# Patient Record
Sex: Female | Born: 1985 | Race: White | Hispanic: No | Marital: Single | Smoking: Current every day smoker
Health system: Southern US, Community
[De-identification: ages and names within clinical notes are randomized; demographics above are authoritative.]

## PROBLEM LIST (undated history)

## (undated) DIAGNOSIS — I10 Essential (primary) hypertension: Secondary | ICD-10-CM

## (undated) DIAGNOSIS — G44209 Tension-type headache, unspecified, not intractable: Secondary | ICD-10-CM

## (undated) DIAGNOSIS — O24419 Gestational diabetes mellitus in pregnancy, unspecified control: Secondary | ICD-10-CM

## (undated) DIAGNOSIS — G43909 Migraine, unspecified, not intractable, without status migrainosus: Secondary | ICD-10-CM

## (undated) HISTORY — PX: TUBAL LIGATION: SHX77

## (undated) HISTORY — PX: WISDOM TOOTH EXTRACTION: SHX21

---

## 2010-09-20 ENCOUNTER — Emergency Department (HOSPITAL_COMMUNITY)
Admission: EM | Admit: 2010-09-20 | Discharge: 2010-09-20 | Disposition: A | Discharge status: Home / Self Care | Payer: Self-pay | Attending: Emergency Medicine | Admitting: Emergency Medicine

## 2010-09-20 ENCOUNTER — Emergency Department (HOSPITAL_COMMUNITY): Payer: Self-pay

## 2010-09-20 DIAGNOSIS — M79609 Pain in unspecified limb: Secondary | ICD-10-CM | POA: Insufficient documentation

## 2010-09-20 DIAGNOSIS — M2010 Hallux valgus (acquired), unspecified foot: Secondary | ICD-10-CM | POA: Insufficient documentation

## 2013-06-30 ENCOUNTER — Emergency Department (HOSPITAL_BASED_OUTPATIENT_CLINIC_OR_DEPARTMENT_OTHER)
Admission: EM | Admit: 2013-06-30 | Discharge: 2013-06-30 | Disposition: A | Discharge status: Home / Self Care | Payer: Medicaid Other | Attending: Emergency Medicine | Admitting: Emergency Medicine

## 2013-06-30 ENCOUNTER — Emergency Department (HOSPITAL_BASED_OUTPATIENT_CLINIC_OR_DEPARTMENT_OTHER): Payer: Medicaid Other

## 2013-06-30 ENCOUNTER — Encounter (HOSPITAL_BASED_OUTPATIENT_CLINIC_OR_DEPARTMENT_OTHER): Payer: Self-pay | Admitting: Emergency Medicine

## 2013-06-30 DIAGNOSIS — Y9289 Other specified places as the place of occurrence of the external cause: Secondary | ICD-10-CM | POA: Insufficient documentation

## 2013-06-30 DIAGNOSIS — M549 Dorsalgia, unspecified: Secondary | ICD-10-CM

## 2013-06-30 DIAGNOSIS — W108XXA Fall (on) (from) other stairs and steps, initial encounter: Secondary | ICD-10-CM | POA: Insufficient documentation

## 2013-06-30 DIAGNOSIS — X500XXA Overexertion from strenuous movement or load, initial encounter: Secondary | ICD-10-CM | POA: Insufficient documentation

## 2013-06-30 DIAGNOSIS — IMO0002 Reserved for concepts with insufficient information to code with codable children: Secondary | ICD-10-CM | POA: Insufficient documentation

## 2013-06-30 DIAGNOSIS — Y9389 Activity, other specified: Secondary | ICD-10-CM | POA: Insufficient documentation

## 2013-06-30 DIAGNOSIS — F17200 Nicotine dependence, unspecified, uncomplicated: Secondary | ICD-10-CM | POA: Insufficient documentation

## 2013-06-30 DIAGNOSIS — I10 Essential (primary) hypertension: Secondary | ICD-10-CM | POA: Insufficient documentation

## 2013-06-30 HISTORY — DX: Essential (primary) hypertension: I10

## 2013-06-30 MED ORDER — CYCLOBENZAPRINE HCL 5 MG PO TABS: 5.0000 mg | ORAL_TABLET | Freq: Two times a day (BID) | ORAL | Status: DC | PRN

## 2013-06-30 MED ORDER — TRAMADOL HCL 50 MG PO TABS: 50.0000 mg | ORAL_TABLET | Freq: Four times a day (QID) | ORAL | Status: DC | PRN

## 2013-06-30 NOTE — Discharge Instructions (Signed)
Back Pain, Adult Low back pain is very common. About 1 in 5 people have back pain.The cause of low back pain is rarely dangerous. The pain often gets better over time.About half of people with a sudden onset of back pain feel better in just 2 weeks. About 8 in 10 people feel better by 6 weeks.  CAUSES Some common causes of back pain include:  Strain of the muscles or ligaments supporting the spine.  Wear and tear (degeneration) of the spinal discs.  Arthritis.  Direct injury to the back. DIAGNOSIS Most of the time, the direct cause of low back pain is not known.However, back pain can be treated effectively even when the exact cause of the pain is unknown.Answering your caregiver's questions about your overall health and symptoms is one of the most accurate ways to make sure the cause of your pain is not dangerous. If your caregiver needs more information, he or she may order lab work or imaging tests (X-rays or MRIs).However, even if imaging tests show changes in your back, this usually does not require surgery. HOME CARE INSTRUCTIONS For many people, back pain returns.Since low back pain is rarely dangerous, it is often a condition that people can learn to manageon their own.   Remain active. It is stressful on the back to sit or stand in one place. Do not sit, drive, or stand in one place for more than 30 minutes at a time. Take short walks on level surfaces as soon as pain allows.Try to increase the length of time you walk each day.  Do not stay in bed.Resting more than 1 or 2 days can delay your recovery.  Do not avoid exercise or work.Your body is made to move.It is not dangerous to be active, even though your back may hurt.Your back will likely heal faster if you return to being active before your pain is gone.  Pay attention to your body when you bend and lift. Many people have less discomfortwhen lifting if they bend their knees, keep the load close to their bodies,and  avoid twisting. Often, the most comfortable positions are those that put less stress on your recovering back.  Find a comfortable position to sleep. Use a firm mattress and lie on your side with your knees slightly bent. If you lie on your back, put a pillow under your knees.  Only take over-the-counter or prescription medicines as directed by your caregiver. Over-the-counter medicines to reduce pain and inflammation are often the most helpful.Your caregiver may prescribe muscle relaxant drugs.These medicines help dull your pain so you can more quickly return to your normal activities and healthy exercise.  Put ice on the injured area.  Put ice in a plastic bag.  Place a towel between your skin and the bag.  Leave the ice on for 15-20 minutes, 03-04 times a day for the first 2 to 3 days. After that, ice and heat may be alternated to reduce pain and spasms.  Ask your caregiver about trying back exercises and gentle massage. This may be of some benefit.  Avoid feeling anxious or stressed.Stress increases muscle tension and can worsen back pain.It is important to recognize when you are anxious or stressed and learn ways to manage it.Exercise is a great option. SEEK MEDICAL CARE IF:  You have pain that is not relieved with rest or medicine.  You have pain that does not improve in 1 week.  You have new symptoms.  You are generally not feeling well. SEEK   IMMEDIATE MEDICAL CARE IF:   You have pain that radiates from your back into your legs.  You develop new bowel or bladder control problems.  You have unusual weakness or numbness in your arms or legs.  You develop nausea or vomiting.  You develop abdominal pain.  You feel faint. Document Released: 01/16/2005 Document Revised: 07/18/2011 Document Reviewed: 06/06/2010 [employer] Patient Information 2014 [employer].  

## 2013-06-30 NOTE — ED Notes (Signed)
Pt states she fell down steps Thursday injuring back and twisted her back Saturday.

## 2013-06-30 NOTE — ED Provider Notes (Signed)
CSN: [id number]     Arrival date & time 06/30/13  1003 History   First MD Initiated Contact with Patient 06/30/13 1006     Chief Complaint  Patient presents with  . Back Pain     (Consider location/radiation/quality/duration/timing/severity/associated sxs/prior Treatment) HPI Comments: Pt c/o lower back pain times 4 days after a fall down the front stairs outside. Pt denies loc with fall. No numbness or weakness. Has tried anti inflammatory without relief. C/o more pain on the right side but it is across the whole lower back. No previous injury. No dysuria  The history is provided by the patient. No language interpreter was used.    Past Medical History  Diagnosis Date  . Hypertension    History reviewed. No pertinent past surgical history. No family history on file. History  Substance Use Topics  . Smoking status: Current Every Day Smoker -- 0.50 packs/day    Types: Cigarettes  . Smokeless tobacco: Not on file  . Alcohol Use: No   OB History   Grav Para Term Preterm Abortions TAB SAB Ect Mult Living                 Review of Systems  Constitutional: Negative.   Respiratory: Negative.   Cardiovascular: Negative.       Allergies  Vicodin  Home Medications   Prior to Admission medications   Not on File   BP 119/100  Pulse 108  Temp(Src) 98.9 F (37.2 C) (Oral)  Resp 16  Ht 5\' 4"  (1.626 m)  Wt 202 lb (91.627 kg)  BMI 34.66 kg/m2  SpO2 96%  LMP 06/17/2013 Physical Exam  Vitals reviewed. Constitutional: She appears well-developed and well-nourished.  Cardiovascular: Normal rate and regular rhythm.   Pulmonary/Chest: Effort normal and breath sounds normal.  Musculoskeletal:  Lumbar spine and paraspinal tenderness  Neurological: She is alert. She exhibits normal muscle tone. Coordination normal.  Skin: Skin is warm and dry. No erythema.  Psychiatric: She has a normal mood and affect.    ED Course  Procedures (including critical care time) Labs  Review Labs Reviewed - No data to display  Imaging Review Dg Lumbar Spine Complete  06/30/2013   CLINICAL DATA:  Recent fall and twisting injury with low back pain.  EXAM: LUMBAR SPINE - COMPLETE 4+ VIEW  COMPARISON:  None.  FINDINGS: Alignment is anatomic. Vertebral body and disc space height are maintained. Minimal endplate degenerative change along the superior endplate of L4. No definite pars defects.  IMPRESSION: No acute findings to explain the patient's pain.   Electronically Signed   By: Leanna Battles M.D.   On: 06/30/2013 10:41     EKG Interpretation None      MDM   Final diagnoses:  Back pain    No bony abnormality. Pt is neurologically intact.will treat symptomatically and give referral to Dr. Vivi Barrack, NP 06/30/13 1055

## 2013-06-30 NOTE — ED Notes (Signed)
Patient transported to X-ray 

## 2013-07-03 NOTE — ED Provider Notes (Signed)
Medical screening examination/treatment/procedure(s) were performed by non-physician practitioner and as supervising physician I was immediately available for consultation/collaboration.   EKG Interpretation None        Candyce Churn III, MD 07/03/13 1115

## 2015-02-08 ENCOUNTER — Encounter (HOSPITAL_COMMUNITY): Payer: Self-pay

## 2015-02-08 ENCOUNTER — Emergency Department (HOSPITAL_COMMUNITY): Payer: Medicaid Other

## 2015-02-08 ENCOUNTER — Emergency Department (HOSPITAL_COMMUNITY)
Admission: EM | Admit: 2015-02-08 | Discharge: 2015-02-08 | Disposition: A | Discharge status: Home / Self Care | Payer: Medicaid Other | Attending: Emergency Medicine | Admitting: Emergency Medicine

## 2015-02-08 DIAGNOSIS — J209 Acute bronchitis, unspecified: Secondary | ICD-10-CM | POA: Diagnosis not present

## 2015-02-08 DIAGNOSIS — Z79899 Other long term (current) drug therapy: Secondary | ICD-10-CM | POA: Diagnosis not present

## 2015-02-08 DIAGNOSIS — O10013 Pre-existing essential hypertension complicating pregnancy, third trimester: Secondary | ICD-10-CM | POA: Insufficient documentation

## 2015-02-08 DIAGNOSIS — Z794 Long term (current) use of insulin: Secondary | ICD-10-CM | POA: Insufficient documentation

## 2015-02-08 DIAGNOSIS — O99513 Diseases of the respiratory system complicating pregnancy, third trimester: Secondary | ICD-10-CM | POA: Insufficient documentation

## 2015-02-08 DIAGNOSIS — O99333 Smoking (tobacco) complicating pregnancy, third trimester: Secondary | ICD-10-CM | POA: Diagnosis not present

## 2015-02-08 DIAGNOSIS — O9989 Other specified diseases and conditions complicating pregnancy, childbirth and the puerperium: Secondary | ICD-10-CM | POA: Diagnosis present

## 2015-02-08 DIAGNOSIS — F17210 Nicotine dependence, cigarettes, uncomplicated: Secondary | ICD-10-CM | POA: Diagnosis not present

## 2015-02-08 DIAGNOSIS — Z3A35 35 weeks gestation of pregnancy: Secondary | ICD-10-CM | POA: Diagnosis not present

## 2015-02-08 MED ORDER — AEROCHAMBER PLUS W/MASK MISC
1.0000 | Freq: Once | Status: DC
  Filled 2015-02-08: qty 1

## 2015-02-08 MED ORDER — ALBUTEROL SULFATE HFA 108 (90 BASE) MCG/ACT IN AERS
2.0000 | INHALATION_SPRAY | RESPIRATORY_TRACT | Status: DC | PRN
  Administered 2015-02-08: 2 via RESPIRATORY_TRACT
  Filled 2015-02-08: qty 6.7

## 2015-02-08 NOTE — ED Notes (Signed)
Patient acknowledges and understands discharge instructions. Signature pad not working.

## 2015-02-08 NOTE — Discharge Instructions (Signed)
Acute Bronchitis Use your albuterol inhaler 2 puffs every 4 hours as needed for cough or shortness of breath. Return if needed more than every 4 hours. Take Tylenol as directed for aches. Use saline nasal spray one squirt into each nostril every 2 hours while awake. Keep your scheduled appointment with your obstetrician in 4 days. Ask your obstetrician to help you to stop smoking. Bronchitis is when the airways that extend from the windpipe into the lungs get red, puffy, and painful (inflamed). Bronchitis often causes thick spit (mucus) to develop. This leads to a cough. A cough is the most common symptom of bronchitis. In acute bronchitis, the condition usually begins suddenly and goes away over time (usually in 2 weeks). Smoking, allergies, and asthma can make bronchitis worse. Repeated episodes of bronchitis may cause more lung problems. HOME CARE  Rest.  Drink enough fluids to keep your pee (urine) clear or pale yellow (unless you need to limit fluids as told by your doctor).  Only take over-the-counter or prescription medicines as told by your doctor.  Avoid smoking and secondhand smoke. These can make bronchitis worse. If you are a smoker, think about using nicotine gum or skin patches. Quitting smoking will help your lungs heal faster.  Reduce the chance of getting bronchitis again by:  Washing your hands often.  Avoiding people with cold symptoms.  Trying not to touch your hands to your mouth, nose, or eyes.  Follow up with your doctor as told. GET HELP IF: Your symptoms do not improve after 1 week of treatment. Symptoms include:  Cough.  Fever.  Coughing up thick spit.  Body aches.  Chest congestion.  Chills.  Shortness of breath.  Sore throat. GET HELP RIGHT AWAY IF:   You have an increased fever.  You have chills.  You have severe shortness of breath.  You have bloody thick spit (sputum).  You throw up (vomit) often.  You lose too much body fluid  (dehydration).  You have a severe headache.  You faint. MAKE SURE YOU:   Understand these instructions.  Will watch your condition.  Will get help right away if you are not doing well or get worse.   This information is not intended to replace advice given to you by your health care provider. Make sure you discuss any questions you have with your health care provider.   Document Released: 07/05/2007 Document Revised: 09/18/2012 Document Reviewed: 07/09/2012 Elsevier Interactive Patient Education [employer]2016 [employer]

## 2015-02-08 NOTE — ED Notes (Signed)
Pt c/o chest congestion and cough x 2 days and generalized body aches, fever, and bilateral ear pain x 1 day.  Pain score 5/10.  Pt reports taking OTC medication w/o relief.  Pt reports that she is [redacted] weeks pregnant.

## 2015-02-08 NOTE — Progress Notes (Addendum)
PT here with flu like symptoms, called here for fetal monitoring. Pt 35 weeks and get care in [place]. Called Dr Adrian Blackwaterstinson, informed pf pt status, reactive NST. OB cleared.

## 2015-02-08 NOTE — ED Provider Notes (Signed)
CSN: [id number]     Arrival date & time 02/08/15  1134 History   First MD Initiated Contact with Patient 02/08/15 1155     Chief Complaint  Patient presents with  . Cough  . Fever  . Generalized Body Aches     (Consider location/radiation/quality/duration/timing/severity/associated sxs/prior Treatment) HPI Complains of cough, nasal congestion onset 3 days ago. Patient's been taking over-the-counter flu medicine, without relief. No vomiting other associated symptoms include subjective fever. No shortness of breath. Patient reports that she checked her blood sugar this morning which was 82. Past Medical History  Diagnosis Date  . Hypertension   . Gestational diabetes mellitus    Past Surgical History  Procedure Laterality Date  . Wisdom tooth extraction     History reviewed. No pertinent family history. Social History  Substance Use Topics  . Smoking status: Current Every Day Smoker -- 0.50 packs/day    Types: Cigarettes  . Smokeless tobacco: None  . Alcohol Use: No   OB History    Gravida Para Term Preterm AB TAB SAB Ectopic Multiple Living   1              Review of Systems  Constitutional: Positive for fever.  HENT: Positive for congestion.   Respiratory: Positive for cough.   Genitourinary:       [redacted] weeks pregnant  Musculoskeletal: Positive for myalgias.       Diffuse myalgias  All other systems reviewed and are negative.     Allergies  Nitrofurantoin and Vicodin  Home Medications   Prior to Admission medications   Medication Sig Start Date End Date Taking? Authorizing Provider  butalbital-acetaminophen-caffeine (FIORICET, ESGIC) 50-325-40 MG tablet Take 1-2 tablets by mouth every 6 (six) hours as needed. 10/09/14  Yes Historical Provider, MD  escitalopram (LEXAPRO) 10 MG tablet Take 10 mg by mouth at bedtime. 01/15/15 01/15/16 Yes Historical Provider, MD  esomeprazole (NEXIUM) 20 MG capsule Take 20 mg by mouth at bedtime. 12/18/14 12/18/15 Yes Historical  Provider, MD  insulin NPH Human (HUMULIN N,NOVOLIN N) 100 UNIT/ML injection Inject 5 Units into the skin at bedtime.   Yes Historical Provider, MD  insulin NPH Human (HUMULIN N,NOVOLIN N) 100 UNIT/ML injection Administer 5 units once daily at bedtime; units subject to change as used during pregnancy. 02/03/15 03/30/15 Yes Historical Provider, MD  Phenylephrine-DM-GG-APAP (TYLENOL COLD/FLU SEVERE) 5-10-200-325 MG TABS Take 1 tablet by mouth every 4 (four) hours as needed (cold/flu symptoms.).   Yes Historical Provider, MD  Prenatal Vit-Fe Fumarate-FA (PRENATAL MULTIVITAMIN) TABS tablet Take 1 tablet by mouth daily at 12 noon.   Yes Historical Provider, MD   BP 114/78 mmHg  Pulse 105  Temp(Src) 98.3 F (36.8 C) (Oral)  Resp 20  SpO2 100% Physical Exam  Constitutional: She appears well-developed and well-nourished.  HENT:  Head: Normocephalic and atraumatic.  Right Ear: External ear normal.  Left Ear: External ear normal.  Nasal congestion. Bilateral tympanic membranes normal  Eyes: Conjunctivae are normal. Pupils are equal, round, and reactive to light.  Neck: Neck supple. No tracheal deviation present. No thyromegaly present.  Cardiovascular: Normal rate and regular rhythm.   No murmur heard. Pulmonary/Chest: Effort normal and breath sounds normal.  Abdominal: Soft. Bowel sounds are normal. She exhibits no distension. There is no tenderness.  Gravid, fetal heart rate 130s  Musculoskeletal: Normal range of motion. She exhibits no edema or tenderness.  Neurological: She is alert. Coordination normal.  Skin: Skin is warm and dry. No rash noted.  Psychiatric: She has a normal mood and affect.  Nursing note and vitals reviewed.   ED Course  Procedures (including critical care time) Labs Review Labs Reviewed - No data to display  Imaging Review No results found. I have personally reviewed and evaluated these images and lab results as part of my medical decision-making.   EKG  Interpretation None     Chest x-ray viewed by me No results found for this or any previous visit. Dg Chest 1 View  02/08/2015  CLINICAL DATA:  Chest congestion and cough. EXAM: CHEST 1 VIEW COMPARISON:  None. FINDINGS: Mediastinum and hilar structures normal. Lungs are clear. Heart size normal. Low lung volumes. No pleural effusion or pneumothorax. IMPRESSION: Low lung volumes.  No acute cardiopulmonary disease . Electronically Signed   By: Maisie Fushomas  Register   On: 02/08/2015 13:19    2:30 PM patient resting comfortably, coughing occasionally MDM  I counseled patient for 5 minutes on smoking cessation Final diagnoses:  None   plan saline nasal spray, albuterol HFA with spacer to go to use 2 puffs every 4 hours when necessary shortness of breath or cough. Return if any more than every 4 hours Keep scheduled appointment with OB/GYN in 4 days Diagnoses #1 acute bronchitis #2 tobacco abuse      Doug SouSam Tayler Lassen, MD 02/08/15 1435

## 2015-07-08 ENCOUNTER — Emergency Department (HOSPITAL_BASED_OUTPATIENT_CLINIC_OR_DEPARTMENT_OTHER)
Admission: EM | Admit: 2015-07-08 | Discharge: 2015-07-08 | Disposition: A | Discharge status: Home / Self Care | Payer: Medicaid Other | Attending: Emergency Medicine | Admitting: Emergency Medicine

## 2015-07-08 ENCOUNTER — Encounter (HOSPITAL_BASED_OUTPATIENT_CLINIC_OR_DEPARTMENT_OTHER): Payer: Self-pay

## 2015-07-08 DIAGNOSIS — Z79899 Other long term (current) drug therapy: Secondary | ICD-10-CM | POA: Insufficient documentation

## 2015-07-08 DIAGNOSIS — F17210 Nicotine dependence, cigarettes, uncomplicated: Secondary | ICD-10-CM | POA: Diagnosis not present

## 2015-07-08 DIAGNOSIS — J0100 Acute maxillary sinusitis, unspecified: Secondary | ICD-10-CM

## 2015-07-08 DIAGNOSIS — I10 Essential (primary) hypertension: Secondary | ICD-10-CM | POA: Diagnosis not present

## 2015-07-08 DIAGNOSIS — R51 Headache: Secondary | ICD-10-CM | POA: Diagnosis present

## 2015-07-08 HISTORY — DX: Tension-type headache, unspecified, not intractable: G44.209

## 2015-07-08 HISTORY — DX: Migraine, unspecified, not intractable, without status migrainosus: G43.909

## 2015-07-08 MED ORDER — AMOXICILLIN 500 MG PO CAPS: 500.0000 mg | ORAL_CAPSULE | Freq: Three times a day (TID) | ORAL | Status: AC

## 2015-07-08 MED ORDER — OXYCODONE-ACETAMINOPHEN 5-325 MG PO TABS: 2.0000 | ORAL_TABLET | ORAL | Status: AC | PRN

## 2015-07-08 MED ORDER — PREDNISONE 50 MG PO TABS
60.0000 mg | ORAL_TABLET | Freq: Once | ORAL | Status: AC
  Administered 2015-07-08: 60 mg via ORAL
  Filled 2015-07-08: qty 1

## 2015-07-08 MED ORDER — PREDNISONE 20 MG PO TABS: 20.0000 mg | ORAL_TABLET | Freq: Two times a day (BID) | ORAL | Status: AC

## 2015-07-08 MED ORDER — DIPHENHYDRAMINE HCL 25 MG PO CAPS
25.0000 mg | ORAL_CAPSULE | Freq: Once | ORAL | Status: AC
  Administered 2015-07-08: 25 mg via ORAL
  Filled 2015-07-08: qty 1

## 2015-07-08 MED ORDER — OXYCODONE-ACETAMINOPHEN 5-325 MG PO TABS
1.0000 | ORAL_TABLET | Freq: Once | ORAL | Status: AC
  Administered 2015-07-08: 1 via ORAL
  Filled 2015-07-08: qty 1

## 2015-07-08 MED ORDER — CETIRIZINE HCL 10 MG PO TABS: 10.0000 mg | ORAL_TABLET | Freq: Every day | ORAL | Status: AC

## 2015-07-08 NOTE — ED Provider Notes (Signed)
CSN: [id number]     Arrival date & time 07/08/15  1950 History  By signing my name below, I, Alyssa Grove, attest that this documentation has been prepared under the direction and in the presence of Rolland Porter, MD. Electronically Signed: Alyssa Grove, ED Scribe. 07/08/2015. 9:00 PM.    Chief Complaint  Patient presents with  . Headache    The history is provided by the patient. No language interpreter was used.    HPI Comments: Brittany Finley is a 30 y.o. female with h/o migraines on prn Fioricet who presents to the Emergency Department complaining of constant, gradual onset, occipital and sinus headache onset 5 days ago. Pt reports associated pain in the roof of her mouth, congestion, mild bilateral ear pain, productive cough with clear sputum and sore throat. She states that her pain is not similar to her migraines, which are typically throbbing in nature, frontal and parietal. Pt used Fioricet, Motrin, and Benedryl with no relief to her pain, which typically relieves her migraines. Pt has hx of asthma, but has not used her inhaler as she ran out of the prescription. Pt denies fever and rhinorrhea.   Past Medical History  Diagnosis Date  . Hypertension   . Gestational diabetes mellitus   . Migraine   . Tension headache    Past Surgical History  Procedure Laterality Date  . Wisdom tooth extraction    . Tubal ligation     No family history on file. Social History  Substance Use Topics  . Smoking status: Current Every Day Smoker -- 0.50 packs/day    Types: Cigarettes  . Smokeless tobacco: None  . Alcohol Use: No   OB History    Gravida Para Term Preterm AB TAB SAB Ectopic Multiple Living   Review of Systems  Constitutional: Negative for fever, chills, diaphoresis, appetite change and fatigue.  HENT: Positive for congestion, ear pain, sinus pressure and sore throat. Negative for mouth sores and trouble swallowing.   Eyes: Negative for visual disturbance.   Respiratory: Positive for cough. Negative for chest tightness, shortness of breath and wheezing.   Cardiovascular: Negative for chest pain.  Gastrointestinal: Negative for nausea, vomiting, abdominal pain, diarrhea and abdominal distention.  Endocrine: Negative for polydipsia, polyphagia and polyuria.  Genitourinary: Negative for dysuria, frequency and hematuria.  Musculoskeletal: Negative for gait problem.  Skin: Negative for color change, pallor and rash.  Neurological: Positive for headaches. Negative for dizziness, syncope and light-headedness.  Hematological: Does not bruise/bleed easily.  Psychiatric/Behavioral: Negative for behavioral problems and confusion.    Allergies  Nitrofurantoin and Vicodin  Home Medications   Prior to Admission medications   Medication Sig Start Date End Date Taking? Authorizing Provider  Varenicline Tartrate (CHANTIX PO) Take by mouth.   Yes Historical Provider, MD  amoxicillin (AMOXIL) 500 MG capsule Take 1 capsule (500 mg total) by mouth 3 (three) times daily. 07/08/15   Rolland Porter, MD  butalbital-acetaminophen-caffeine (FIORICET, ESGIC) [phone] MG tablet Take 1-2 tablets by mouth every 6 (six) hours as needed. 10/09/14   Historical Provider, MD  cetirizine (ZYRTEC) 10 MG tablet Take 1 tablet (10 mg total) by mouth daily. 1 po q day prn allergies 07/08/15   Rolland Porter, MD  insulin NPH Human (HUMULIN N,NOVOLIN N) 100 UNIT/ML injection Administer 5 units once daily at bedtime; units subject to change as used during pregnancy. 02/03/15 03/30/15  Historical Provider, MD  oxyCODONE-acetaminophen (PERCOCET/ROXICET) 5-325  MG tablet Take 2 tablets by mouth every 4 (four) hours as needed. 07/08/15   Rolland PorterMark Chau Sawin, MD  predniSONE (DELTASONE) 20 MG tablet Take 1 tablet (20 mg total) by mouth 2 (two) times daily with a meal. 07/08/15   Rolland PorterMark Kenzly Rogoff, MD   BP 131/89 mmHg  Pulse 74  Temp(Src) 98.8 F (37.1 C) (Oral)  Resp 20  Ht 5\' 3"  (1.6 m)  Wt 180 lb (81.647 kg)  BMI 31.89  kg/m2  SpO2 97%  LMP 05/31/2015  Breastfeeding? Unknown Physical Exam  Constitutional: She is oriented to person, place, and time. She appears well-developed and well-nourished. No distress.  HENT:  Head: Normocephalic.  Mouth/Throat: No oropharyngeal exudate.  Positive nasal Erythema and mucosal edema. Mild oropharyngeal erythema. No tonsillar hypertrophy or exudate. Mild erythema and effusion to bilateral TMs. Bilateral occipital tenderness.   Eyes: Conjunctivae are normal. Pupils are equal, round, and reactive to light. No scleral icterus.  Neck: Normal range of motion. Neck supple. No thyromegaly present.  Cardiovascular: Normal rate and regular rhythm.  Exam reveals no gallop and no friction rub.   No murmur heard. Pulmonary/Chest: Effort normal. No respiratory distress. She has wheezes. She has no rales.  + expiatory wheeze in all fields   Abdominal: Soft. Bowel sounds are normal. She exhibits no distension. There is no tenderness. There is no rebound.  Musculoskeletal: Normal range of motion.  Neurological: She is alert and oriented to person, place, and time.  Skin: Skin is warm and dry. No rash noted.  Psychiatric: She has a normal mood and affect. Her behavior is normal.  Nursing note and vitals reviewed.   ED Course  Procedures (including critical care time) DIAGNOSTIC STUDIES: Oxygen Saturation is 97% on RA, normal by my interpretation.    COORDINATION OF CARE: 8:52 PM Discussed treatment plan with pt at bedside which includes steroids and pt agreed to plan.    MDM   Final diagnoses:  Acute maxillary sinusitis, recurrence not specified    Upper respiratory infection symptoms with sinus pain and pressure. Will treat with steroids and antihistamine. I given a prescription for amoxicillin. I have asked her to wait 48-72 hours before initiating. If not improving may fill and take.   Rolland PorterMark [place], MD 07/08/15 2103

## 2015-07-08 NOTE — ED Notes (Signed)
Pt endorses photo- and phonophobia with the HA. Pt states this is different from her usual migraines in that her nose and throat ache. Pt states she has some post nasal drainage. Pt also states she has a productive cough with thick cloudy sputum.

## 2015-07-08 NOTE — ED Notes (Signed)
C/o HA since Sunday-reports hx of tension HA and migraine-no relief with fiorcet-NAD-steady gait

## 2015-07-08 NOTE — Discharge Instructions (Signed)
If your symptoms do not improve the next 48 hours, fill and take your prescription for amoxicillin.   Sinusitis, Adult Sinusitis is redness, soreness, and inflammation of the paranasal sinuses. Paranasal sinuses are air pockets within the bones of your face. They are located beneath your eyes, in the middle of your forehead, and above your eyes. In healthy paranasal sinuses, mucus is able to drain out, and air is able to circulate through them by way of your nose. However, when your paranasal sinuses are inflamed, mucus and air can become trapped. This can allow bacteria and other germs to grow and cause infection. Sinusitis can develop quickly and last only a short time (acute) or continue over a long period (chronic). Sinusitis that lasts for more than 12 weeks is considered chronic. CAUSES Causes of sinusitis include:  Allergies.  Structural abnormalities, such as displacement of the cartilage that separates your nostrils (deviated septum), which can decrease the air flow through your nose and sinuses and affect sinus drainage.  Functional abnormalities, such as when the small hairs (cilia) that line your sinuses and help remove mucus do not work properly or are not present. SIGNS AND SYMPTOMS Symptoms of acute and chronic sinusitis are the same. The primary symptoms are pain and pressure around the affected sinuses. Other symptoms include:  Upper toothache.  Earache.  Headache.  Bad breath.  Decreased sense of smell and taste.  A cough, which worsens when you are lying flat.  Fatigue.  Fever.  Thick drainage from your nose, which often is green and may contain pus (purulent).  Swelling and warmth over the affected sinuses. DIAGNOSIS Your health care provider will perform a physical exam. During your exam, your health care provider may perform any of the following to help determine if you have acute sinusitis or chronic sinusitis:  Look in your nose for signs of abnormal  growths in your nostrils (nasal polyps).  Tap over the affected sinus to check for signs of infection.  View the inside of your sinuses using an imaging device that has a light attached (endoscope). If your health care provider suspects that you have chronic sinusitis, one or more of the following tests may be recommended:  Allergy tests.  Nasal culture. A sample of mucus is taken from your nose, sent to a lab, and screened for bacteria.  Nasal cytology. A sample of mucus is taken from your nose and examined by your health care provider to determine if your sinusitis is related to an allergy. TREATMENT Most cases of acute sinusitis are related to a viral infection and will resolve on their own within 10 days. Sometimes, medicines are prescribed to help relieve symptoms of both acute and chronic sinusitis. These may include pain medicines, decongestants, nasal steroid sprays, or saline sprays. However, for sinusitis related to a bacterial infection, your health care provider will prescribe antibiotic medicines. These are medicines that will help kill the bacteria causing the infection. Rarely, sinusitis is caused by a fungal infection. In these cases, your health care provider will prescribe antifungal medicine. For some cases of chronic sinusitis, surgery is needed. Generally, these are cases in which sinusitis recurs more than 3 times per year, despite other treatments. HOME CARE INSTRUCTIONS  Drink plenty of water. Water helps thin the mucus so your sinuses can drain more easily.  Use a humidifier.  Inhale steam 3-4 times a day (for example, sit in the bathroom with the shower running).  Apply a warm, moist washcloth to your face  3-4 times a day, or as directed by your health care provider.  Use saline nasal sprays to help moisten and clean your sinuses.  Take medicines only as directed by your health care provider.  If you were prescribed either an antibiotic or antifungal medicine,  finish it all even if you start to feel better. SEEK IMMEDIATE MEDICAL CARE IF:  You have increasing pain or severe headaches.  You have nausea, vomiting, or drowsiness.  You have swelling around your face.  You have vision problems.  You have a stiff neck.  You have difficulty breathing.   This information is not intended to replace advice given to you by your health care provider. Make sure you discuss any questions you have with your health care provider.   Document Released: 01/16/2005 Document Revised: 02/06/2014 Document Reviewed: 01/31/2011 Elsevier Interactive Patient Education [employer]2016 [employer] Inc.

## 2015-07-08 NOTE — ED Notes (Signed)
Pt teaching provided on the medications that she was given today which may cause drowsiness. Pt instructed not to drive or operate heavy machinery. Pt verbalized understanding.

## 2015-09-11 ENCOUNTER — Emergency Department (HOSPITAL_BASED_OUTPATIENT_CLINIC_OR_DEPARTMENT_OTHER): Payer: Medicaid Other

## 2015-09-11 ENCOUNTER — Encounter (HOSPITAL_BASED_OUTPATIENT_CLINIC_OR_DEPARTMENT_OTHER): Payer: Self-pay | Admitting: Emergency Medicine

## 2015-09-11 ENCOUNTER — Emergency Department (HOSPITAL_BASED_OUTPATIENT_CLINIC_OR_DEPARTMENT_OTHER)
Admission: EM | Admit: 2015-09-11 | Discharge: 2015-09-11 | Disposition: A | Discharge status: Home / Self Care | Payer: Medicaid Other | Attending: Emergency Medicine | Admitting: Emergency Medicine

## 2015-09-11 DIAGNOSIS — Z794 Long term (current) use of insulin: Secondary | ICD-10-CM | POA: Diagnosis not present

## 2015-09-11 DIAGNOSIS — M79672 Pain in left foot: Secondary | ICD-10-CM | POA: Insufficient documentation

## 2015-09-11 DIAGNOSIS — I10 Essential (primary) hypertension: Secondary | ICD-10-CM | POA: Diagnosis not present

## 2015-09-11 DIAGNOSIS — F17210 Nicotine dependence, cigarettes, uncomplicated: Secondary | ICD-10-CM | POA: Diagnosis not present

## 2015-09-11 DIAGNOSIS — E119 Type 2 diabetes mellitus without complications: Secondary | ICD-10-CM | POA: Diagnosis not present

## 2015-09-11 NOTE — Discharge Instructions (Signed)
X rays do not show a foreign body in your foot.  It is recommended that you see an orthopedic Dr. Melynda Kellerr Podiatist to discuss a procedure to explore your foot, or additional imaging tests to to identify its location.  Explorations of a foot without obvious foreign bodies are not performed in the ER.  You have been given the name of our local othopedic surgeon on call to contact. Or you may call a podiatrist of your choice.

## 2015-09-11 NOTE — ED Notes (Signed)
MD at bedside. 

## 2015-09-11 NOTE — ED Notes (Signed)
Patient transported to X-ray 

## 2015-09-11 NOTE — ED Triage Notes (Signed)
Pt in c/o possible glass FB to bottom of L foot, states has likely been there for approx 2 months but now is burning and itching. Ambulatory in NAD.

## 2015-09-11 NOTE — ED Provider Notes (Signed)
[facility]   CSN: [id number] Arrival date & time: 09/11/15  2139  First Provider Contact:  9:59 PM    By signing my name below, I, Vista Mink, attest that this documentation has been prepared under the direction and in the presence of Rolland Porter, MD. Electronically signed, Vista Mink, ED Scribe. 09/11/15. 10:11 PM.  History   Chief Complaint Chief Complaint  Patient presents with  . Foot Pain    HPI HPI Comments: Brittany Finley is a 30 y.o. female who presents to the Emergency Department complaining of possible foreign body in the bottom of her left foot; onset two months ago. Pt states she stepped on broken glass almost two months ago and complains of gradually worsening pain to the sole of her foot. Pt states that her mom attempted to remove any foreign bodies in the foot but then instructed the pt to come to the ED for evaluation.    The history is provided by the patient. No language interpreter was used.    Past Medical History:  Diagnosis Date  . Gestational diabetes mellitus   . Hypertension   . Migraine   . Tension headache     There are no active problems to display for this patient.   Past Surgical History:  Procedure Laterality Date  . TUBAL LIGATION    . WISDOM TOOTH EXTRACTION      OB History    Gravida Para Term Preterm AB Living   5 3 2 1   3    SAB TAB Ectopic Multiple Live Births                   Home Medications    Prior to Admission medications   Medication Sig Start Date End Date Taking? Authorizing Provider  amoxicillin (AMOXIL) 500 MG capsule Take 1 capsule (500 mg total) by mouth 3 (three) times daily. 07/08/15   Rolland Porter, MD  butalbital-acetaminophen-caffeine (FIORICET, ESGIC) [phone] MG tablet Take 1-2 tablets by mouth every 6 (six) hours as needed. 10/09/14   Historical Provider, MD  cetirizine (ZYRTEC) 10 MG tablet Take 1 tablet (10 mg total) by mouth daily. 1 po q day prn allergies 07/08/15   Rolland Porter, MD   insulin NPH Human (HUMULIN N,NOVOLIN N) 100 UNIT/ML injection Administer 5 units once daily at bedtime; units subject to change as used during pregnancy. 02/03/15 03/30/15  Historical Provider, MD  oxyCODONE-acetaminophen (PERCOCET/ROXICET) 5-325 MG tablet Take 2 tablets by mouth every 4 (four) hours as needed. 07/08/15   Rolland Porter, MD  predniSONE (DELTASONE) 20 MG tablet Take 1 tablet (20 mg total) by mouth 2 (two) times daily with a meal. 07/08/15   Rolland Porter, MD  Varenicline Tartrate (CHANTIX PO) Take by mouth.    Historical Provider, MD    Family History History reviewed. No pertinent family history.  Social History Social History  Substance Use Topics  . Smoking status: Current Every Day Smoker    Packs/day: 0.50    Types: Cigarettes  . Smokeless tobacco: Not on file  . Alcohol use No     Allergies   Nitrofurantoin and Vicodin [hydrocodone-acetaminophen]   Review of Systems Review of Systems  Constitutional: Negative for appetite change, chills, diaphoresis, fatigue and fever.  HENT: Negative for mouth sores, sore throat and trouble swallowing.   Eyes: Negative for visual disturbance.  Respiratory: Negative for cough, chest tightness, shortness of breath and wheezing.   Cardiovascular: Negative for chest pain.  Gastrointestinal: Negative for  abdominal distention, abdominal pain, diarrhea, nausea and vomiting.  Endocrine: Negative for polydipsia, polyphagia and polyuria.  Genitourinary: Negative for dysuria, frequency and hematuria.  Musculoskeletal: Positive for arthralgias (left foot). Negative for gait problem.  Skin: Negative for color change, pallor and rash.  Neurological: Negative for dizziness, syncope, light-headedness and headaches.  Hematological: Does not bruise/bleed easily.  Psychiatric/Behavioral: Negative for behavioral problems and confusion.  All other systems reviewed and are negative.  Physical Exam Updated Vital Signs BP 119/87 (BP Location: Left Arm)    Pulse 94   Temp 98.1 F (36.7 C) (Oral)   Resp 20   Ht 5\' 3"  (1.6 m)   Wt 180 lb (81.6 kg)   LMP 09/05/2015   SpO2 99%   BMI 31.89 kg/m   Physical Exam  Constitutional: She is oriented to person, place, and time. She appears well-developed and well-nourished. No distress.  HENT:  Head: Normocephalic.  Eyes: Conjunctivae are normal. Pupils are equal, round, and reactive to light. No scleral icterus.  Neck: Normal range of motion. Neck supple. No thyromegaly present.  Cardiovascular: Normal rate and regular rhythm.  Exam reveals no gallop and no friction rub.   No murmur heard. Pulmonary/Chest: Effort normal and breath sounds normal. No respiratory distress. She has no wheezes. She has no rales.  Abdominal: Soft. Bowel sounds are normal. She exhibits no distension. There is no tenderness. There is no rebound.  Musculoskeletal: Normal range of motion.  Neurological: She is alert and oriented to person, place, and time.  Skin: Skin is warm and dry. No rash noted.  Pt indiactes over the area of the sole of the foot near first metatarsal head. No erythema or signs of infection or foreign body  Psychiatric: She has a normal mood and affect. Her behavior is normal.     ED Treatments / Results  DIAGNOSTIC STUDIES: Oxygen Saturation is 99% on RA, normal by my interpretation.  COORDINATION OF CARE: 9:53 PM-Will order imaging of the foot. Discussed treatment plan with pt at bedside and pt agreed to plan.   Labs (all labs ordered are listed, but only abnormal results are displayed) Labs Reviewed - No data to display  EKG  EKG Interpretation None       Radiology Dg Foot Complete Left  Result Date: 09/11/2015 CLINICAL DATA:  Sensation of a small piece of glass in the left foot after stepping on a broken vase 2 months ago. EXAM: LEFT FOOT - COMPLETE 3+ VIEW COMPARISON:  09/20/2010. FINDINGS: A hallux valgus deformity is again demonstrated with valgus deformities involving all of  the MTP joints. No radiopaque foreign body in the area of patient concern in the plantar aspect of the distal foot, marked with a metallic marker. There is a suggestion of soft tissue swelling in that region. No fracture seen. IMPRESSION: 1. No radiopaque foreign body. 2. 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 Electronically Signed   By: Beckie SaltsSteven  Reid M.D.   On: 09/11/2015 22:21    Procedures Procedures (including critical care time)  Medications Ordered in ED Medications - No data to display   Initial Impression / Assessment and Plan / ED Course  I have reviewed the triage vital signs and the nursing notes.  Pertinent labs & imaging results that were available during my care of the patient were reviewed by me and considered in my medical decision making (see chart for details).  Clinical Course    X-ray show no obvious  foreign body. I explained to the patient that it is not fruitful to attempt to explore a foot for a warm body when the foreign bodies obvious clinically or radiographically. Referred her to orthopedics and/or podiatry for consideration for further imaging or exploration.  Final Clinical Impressions(s) / ED Diagnoses   Final diagnoses:  Foot pain, left    New Prescriptions New Prescriptions   No medications on file      Rolland Porter, MD 09/11/15 2225

## 2016-08-23 ENCOUNTER — Emergency Department (HOSPITAL_BASED_OUTPATIENT_CLINIC_OR_DEPARTMENT_OTHER)
Admission: EM | Admit: 2016-08-23 | Discharge: 2016-08-24 | Disposition: A | Discharge status: Home / Self Care | Payer: Self-pay | Attending: Emergency Medicine | Admitting: Emergency Medicine

## 2016-08-23 ENCOUNTER — Encounter (HOSPITAL_BASED_OUTPATIENT_CLINIC_OR_DEPARTMENT_OTHER): Payer: Self-pay

## 2016-08-23 ENCOUNTER — Emergency Department (HOSPITAL_BASED_OUTPATIENT_CLINIC_OR_DEPARTMENT_OTHER): Payer: Self-pay

## 2016-08-23 DIAGNOSIS — Z79899 Other long term (current) drug therapy: Secondary | ICD-10-CM | POA: Insufficient documentation

## 2016-08-23 DIAGNOSIS — Y9289 Other specified places as the place of occurrence of the external cause: Secondary | ICD-10-CM | POA: Insufficient documentation

## 2016-08-23 DIAGNOSIS — Y9389 Activity, other specified: Secondary | ICD-10-CM | POA: Insufficient documentation

## 2016-08-23 DIAGNOSIS — Y999 Unspecified external cause status: Secondary | ICD-10-CM | POA: Insufficient documentation

## 2016-08-23 DIAGNOSIS — W5501XA Bitten by cat, initial encounter: Secondary | ICD-10-CM | POA: Insufficient documentation

## 2016-08-23 DIAGNOSIS — F17210 Nicotine dependence, cigarettes, uncomplicated: Secondary | ICD-10-CM | POA: Insufficient documentation

## 2016-08-23 DIAGNOSIS — S60871A Other superficial bite of right wrist, initial encounter: Secondary | ICD-10-CM | POA: Insufficient documentation

## 2016-08-23 DIAGNOSIS — Z8632 Personal history of gestational diabetes: Secondary | ICD-10-CM | POA: Insufficient documentation

## 2016-08-23 DIAGNOSIS — I10 Essential (primary) hypertension: Secondary | ICD-10-CM | POA: Insufficient documentation

## 2016-08-23 HISTORY — DX: Gestational diabetes mellitus in pregnancy, unspecified control: O24.419

## 2016-08-23 MED ORDER — ACETAMINOPHEN 500 MG PO TABS
1000.0000 mg | ORAL_TABLET | Freq: Once | ORAL | Status: AC
  Administered 2016-08-23: 1000 mg via ORAL
  Filled 2016-08-23: qty 2

## 2016-08-23 MED ORDER — AMOXICILLIN-POT CLAVULANATE 875-125 MG PO TABS: 1.0000 | ORAL_TABLET | Freq: Two times a day (BID) | ORAL | 0 refills | Status: AC

## 2016-08-23 MED ORDER — AMOXICILLIN-POT CLAVULANATE 875-125 MG PO TABS
1.0000 | ORAL_TABLET | Freq: Once | ORAL | Status: AC
  Administered 2016-08-23: 1 via ORAL
  Filled 2016-08-23: qty 1

## 2016-08-23 NOTE — ED Notes (Signed)
Pt states she checked with friend and cats shots are UTD

## 2016-08-23 NOTE — ED Provider Notes (Signed)
[facility]   CSN: [id number] Arrival date & time: 08/23/16  2108   By signing my name below, I, Brittany Finley, attest that this documentation has been prepared under the direction and in the presence of Tilden Fossa, MD. Electronically signed, Brittany Finley, ED Scribe. 08/23/16. 10:30 PM.  History   Chief Complaint Chief Complaint  Patient presents with  . Animal Bite   The history is provided by the patient and medical records. No language interpreter was used.    Brittany Finley is a 31 y.o. female presenting to the Emergency Department concerning L wrist pain s/p an animal bite that occurred ~6 PM today. Small superficial wounds noted. No active bleeding. She states a cat attacked her after she accidentally touched it on the abdomen. Pt describes mild to moderate pain only when moving the wrist. She states she applied diluted alcohol and took an epsom salt bath, and one of her wounds continued to drain, though she her other superficial wounds have closed. Pt states she has taken ibuprofen without relief at home. No fever, abdominal pain, N/V, diaphoresis, numbness or weakness. No other complaints at this time.   Past Medical History:  Diagnosis Date  . Gestational diabetes    gestational  . Hypertension   . Migraine   . Tension headache     There are no active problems to display for this patient.   Past Surgical History:  Procedure Laterality Date  . TUBAL LIGATION    . WISDOM TOOTH EXTRACTION      OB History    Gravida Para Term Preterm AB Living   5 3 2 1   3    SAB TAB Ectopic Multiple Live Births                   Home Medications    Prior to Admission medications   Medication Sig Start Date End Date Taking? Authorizing Provider  escitalopram (LEXAPRO) 10 MG tablet Take 10 mg by mouth daily.   Yes [provider]  amoxicillin (AMOXIL) 500 MG capsule Take 1 capsule (500 mg total) by mouth 3 (three) times daily. 07/08/15    Rolland Porter, MD  butalbital-acetaminophen-caffeine (FIORICET, ESGIC) [phone] MG tablet Take 1-2 tablets by mouth every 6 (six) hours as needed. 10/09/14   [provider]  cetirizine (ZYRTEC) 10 MG tablet Take 1 tablet (10 mg total) by mouth daily. 1 po q day prn allergies 07/08/15   Rolland Porter, MD  insulin NPH Human (HUMULIN N,NOVOLIN N) 100 UNIT/ML injection Administer 5 units once daily at bedtime; units subject to change as used during pregnancy. 02/03/15 03/30/15  [provider]  oxyCODONE-acetaminophen (PERCOCET/ROXICET) 5-325 MG tablet Take 2 tablets by mouth every 4 (four) hours as needed. 07/08/15   Rolland Porter, MD  predniSONE (DELTASONE) 20 MG tablet Take 1 tablet (20 mg total) by mouth 2 (two) times daily with a meal. 07/08/15   Rolland Porter, MD  Varenicline Tartrate (CHANTIX PO) Take by mouth.    [provider]    Family History No family history on file.  Social History Social History  Substance Use Topics  . Smoking status: Current Every Day Smoker    Packs/day: 0.50    Types: Cigarettes  . Smokeless tobacco: Never Used  . Alcohol use No     Allergies   Nitrofurantoin and Vicodin [hydrocodone-acetaminophen]   Review of Systems Review of Systems  Constitutional: Negative for chills, diaphoresis and fever.  Respiratory: Negative for  shortness of breath.   Cardiovascular: Negative for palpitations.  Gastrointestinal: Negative for abdominal pain, nausea and vomiting.  Musculoskeletal: Negative for joint swelling.  Skin: Positive for color change and wound. Negative for rash.  Neurological: Negative for weakness and numbness.  All other systems reviewed and are negative.    Physical Exam Updated Vital Signs BP (!) 146/98 (BP Location: Left Arm)   Pulse (!) 106   Temp 98.6 F (37 C) (Oral)   Resp 18   Ht 5\' 4"  (1.626 m)   Wt 200 lb (90.7 kg)   LMP 08/10/2016   SpO2 97%   BMI 34.33 kg/m   Physical Exam  Constitutional: She is oriented  to person, place, and time. She appears well-developed and well-nourished.  HENT:  Head: Normocephalic and atraumatic.  Cardiovascular: Normal rate and regular rhythm.   No murmur heard. Pulmonary/Chest: Effort normal and breath sounds normal. No respiratory distress.  Abdominal: There is no guarding.  Musculoskeletal:  2+ radial pulses to the right upper extremity. There is moderate swelling and mild erythema to the radial aspect of the right wrist. Scattered abrasions over the right wrist. Left forearm with multiple linear scars. Wiggles all digits without difficulty. Flexion and extension intact at the wrist.  Neurological: She is alert and oriented to person, place, and time.  Skin: Skin is warm and dry.  Psychiatric: She has a normal mood and affect. Her behavior is normal.  Nursing note and vitals reviewed.    ED Treatments / Results  DIAGNOSTIC STUDIES: Oxygen Saturation is 97% on RA, NL by my interpretation.    COORDINATION OF CARE: 10:25 PM-Discussed next steps with pt. Pt verbalized understanding and is agreeable with the plan. Will order imaging and medications. Will Rx medications.   Labs (all labs ordered are listed, but only abnormal results are displayed) Labs Reviewed - No data to display  EKG  EKG Interpretation None       Radiology Dg Wrist Complete Right  Result Date: 08/23/2016 CLINICAL DATA:  Erythema and swelling after CT bites tonight. EXAM: RIGHT WRIST - COMPLETE 3+ VIEW COMPARISON:  None. FINDINGS: There is no evidence of fracture or dislocation. There is no evidence of arthropathy or other focal bone abnormality. Soft tissues are unremarkable. IMPRESSION: Negative. Electronically Signed   By: Ellery Plunkaniel R Mitchell M.D.   On: 08/23/2016 23:35    Procedures Procedures (including critical care time)  Medications Ordered in ED Medications  acetaminophen (TYLENOL) tablet 1,000 mg (1,000 mg Oral Given 08/23/16 2237)     Initial Impression / Assessment  and Plan / ED Course  I have reviewed the triage vital signs and the nursing notes.  Pertinent labs & imaging results that were available during my care of the patient were reviewed by me and considered in my medical decision making (see chart for details).     Patient here for evaluation of wrist pain following a cat bite. Immunizations are up to date on the cat. She has some local tenderness over the radial aspect of the right wrist with no evidence of acute fracture or tendon injury. There is no active infection at this time. Wound was washed with soap and water in the ED. Plan to start on prophylactic antibiotics with local wound care. Discussed close outpatient follow-up and return precautions.  Final Clinical Impressions(s) / ED Diagnoses   Final diagnoses:  Cat bite, initial encounter    New Prescriptions New Prescriptions   No medications on file  I personally performed the  services described in this documentation, which was scribed in my presence. The recorded information has been reviewed and is accurate.    Tilden Fossaees, Ira Busbin, MD 08/23/16 2358

## 2016-08-23 NOTE — ED Triage Notes (Signed)
Reports was bit by a friends cat a couple hours ago to right wrist.  Reports unsure about shot records but cat is an inside and outside cat.

## 2016-08-23 NOTE — ED Notes (Signed)
Pt c/o unable to move wrist, pt sitting in bed texting with effected hand

## 2016-08-23 NOTE — ED Notes (Signed)
PMS intact before and after. Pt tolerated well. All questions answered. 

## 2017-03-15 ENCOUNTER — Emergency Department (HOSPITAL_BASED_OUTPATIENT_CLINIC_OR_DEPARTMENT_OTHER)
Admission: EM | Admit: 2017-03-15 | Discharge: 2017-03-15 | Disposition: A | Discharge status: Home / Self Care | Payer: Self-pay | Attending: Physician Assistant | Admitting: Physician Assistant

## 2017-03-15 ENCOUNTER — Other Ambulatory Visit: Payer: Self-pay

## 2017-03-15 ENCOUNTER — Encounter (HOSPITAL_BASED_OUTPATIENT_CLINIC_OR_DEPARTMENT_OTHER): Payer: Self-pay | Admitting: Emergency Medicine

## 2017-03-15 DIAGNOSIS — R112 Nausea with vomiting, unspecified: Secondary | ICD-10-CM | POA: Insufficient documentation

## 2017-03-15 DIAGNOSIS — R197 Diarrhea, unspecified: Secondary | ICD-10-CM | POA: Insufficient documentation

## 2017-03-15 DIAGNOSIS — I10 Essential (primary) hypertension: Secondary | ICD-10-CM | POA: Insufficient documentation

## 2017-03-15 DIAGNOSIS — F17210 Nicotine dependence, cigarettes, uncomplicated: Secondary | ICD-10-CM | POA: Insufficient documentation

## 2017-03-15 DIAGNOSIS — Z79899 Other long term (current) drug therapy: Secondary | ICD-10-CM | POA: Insufficient documentation

## 2017-03-15 LAB — DIFFERENTIAL
BASOS ABS: 0 10*3/uL (ref 0.0–0.1)
Basophils Relative: 0 %
Eosinophils Absolute: 0 10*3/uL (ref 0.0–0.7)
Eosinophils Relative: 0 %
LYMPHS ABS: 0.5 10*3/uL — AB (ref 0.7–4.0)
Lymphocytes Relative: 3 %
MONOS PCT: 1 %
Monocytes Absolute: 0.3 10*3/uL (ref 0.1–1.0)
NEUTROS ABS: 18.3 10*3/uL — AB (ref 1.7–7.7)
NEUTROS PCT: 96 %

## 2017-03-15 LAB — COMPREHENSIVE METABOLIC PANEL
ALBUMIN: 4.7 g/dL (ref 3.5–5.0)
ALT: 21 U/L (ref 14–54)
AST: 24 U/L (ref 15–41)
Alkaline Phosphatase: 92 U/L (ref 38–126)
Anion gap: 12 (ref 5–15)
BILIRUBIN TOTAL: 0.8 mg/dL (ref 0.3–1.2)
BUN: 18 mg/dL (ref 6–20)
CHLORIDE: 105 mmol/L (ref 101–111)
CO2: 21 mmol/L — ABNORMAL LOW (ref 22–32)
CREATININE: 0.89 mg/dL (ref 0.44–1.00)
Calcium: 8.6 mg/dL — ABNORMAL LOW (ref 8.9–10.3)
GFR calc Af Amer: >60 mL/min (ref >60)
GLUCOSE: 134 mg/dL — AB (ref 65–99)
Potassium: 3.9 mmol/L (ref 3.5–5.1)
Sodium: 138 mmol/L (ref 135–145)
TOTAL PROTEIN: 8.1 g/dL (ref 6.5–8.1)

## 2017-03-15 LAB — CBC
HEMATOCRIT: 52.9 % — AB (ref 36.0–46.0)
Hemoglobin: 18.4 g/dL — ABNORMAL HIGH (ref 12.0–15.0)
MCH: 32.1 pg (ref 26.0–34.0)
MCHC: 34.8 g/dL (ref 30.0–36.0)
MCV: 92.3 fL (ref 78.0–100.0)
Platelets: 236 10*3/uL (ref 150–400)
RBC: 5.73 MIL/uL — ABNORMAL HIGH (ref 3.87–5.11)
RDW: 12.6 % (ref 11.5–15.5)
WBC: 19.3 10*3/uL — ABNORMAL HIGH (ref 4.0–10.5)

## 2017-03-15 LAB — URINALYSIS, ROUTINE W REFLEX MICROSCOPIC
GLUCOSE, UA: NEGATIVE mg/dL
Ketones, ur: 15 mg/dL — AB
Leukocytes, UA: NEGATIVE
Nitrite: NEGATIVE
Protein, ur: 100 mg/dL — AB
pH: 5.5 (ref 5.0–8.0)

## 2017-03-15 LAB — URINALYSIS, MICROSCOPIC (REFLEX)

## 2017-03-15 LAB — PREGNANCY, URINE: PREG TEST UR: NEGATIVE

## 2017-03-15 LAB — LIPASE, BLOOD: LIPASE: 20 U/L (ref 11–51)

## 2017-03-15 MED ORDER — ONDANSETRON HCL 4 MG/2ML IJ SOLN
4.0000 mg | Freq: Once | INTRAMUSCULAR | Status: AC | PRN
  Administered 2017-03-15: 4 mg via INTRAVENOUS
  Filled 2017-03-15: qty 2

## 2017-03-15 MED ORDER — ONDANSETRON 4 MG PO TBDP: 4.0000 mg | ORAL_TABLET | Freq: Three times a day (TID) | ORAL | 0 refills | Status: AC | PRN

## 2017-03-15 MED ORDER — SODIUM CHLORIDE 0.9 % IV BOLUS (SEPSIS)
1000.0000 mL | Freq: Once | INTRAVENOUS | Status: AC
  Administered 2017-03-15: 1000 mL via INTRAVENOUS

## 2017-03-15 NOTE — ED Triage Notes (Signed)
Reports vomiting and diarrhea, fever and chills since this morning.  Also endorses headache.

## 2017-03-15 NOTE — ED Provider Notes (Signed)
[facility] EMERGENCY DEPARTMENT Provider Note   CSN: [id number] Arrival date & time: 03/15/17  1454     History   Chief Complaint Chief Complaint  Patient presents with  . Emesis  . Diarrhea    HPI Brittany Finley is a 32 y.o. female.  HPI   32 year old female presenting with nausea vomiting diarrhea.  Her children had the same thing yesterday.  Now she has it.  No abdominal pain.  No urinary complaints.  Has not tried any medications.  Past Medical History:  Diagnosis Date  . Gestational diabetes    gestational  . Hypertension   . Migraine   . Tension headache     There are no active problems to display for this patient.   Past Surgical History:  Procedure Laterality Date  . TUBAL LIGATION    . WISDOM TOOTH EXTRACTION      OB History    Gravida Para Term Preterm AB Living   5 3 2 1   3    SAB TAB Ectopic Multiple Live Births                   Home Medications    Prior to Admission medications   Medication Sig Start Date End Date Taking? Authorizing Provider  amoxicillin (AMOXIL) 500 MG capsule Take 1 capsule (500 mg total) by mouth 3 (three) times daily. 07/08/15   Rolland PorterJames, Mark, MD  amoxicillin-clavulanate (AUGMENTIN) 875-125 MG tablet Take 1 tablet by mouth every 12 (twelve) hours. 08/23/16   Tilden Fossaees, Elizabeth, MD  butalbital-acetaminophen-caffeine (FIORICET, ESGIC) [phone] MG tablet Take 1-2 tablets by mouth every 6 (six) hours as needed. 10/09/14   [provider]  cetirizine (ZYRTEC) 10 MG tablet Take 1 tablet (10 mg total) by mouth daily. 1 po q day prn allergies 07/08/15   Rolland PorterJames, Mark, MD  escitalopram (LEXAPRO) 10 MG tablet Take 10 mg by mouth daily.    [provider]  insulin NPH Human (HUMULIN N,NOVOLIN N) 100 UNIT/ML injection Administer 5 units once daily at bedtime; units subject to change as used during pregnancy. 02/03/15 03/30/15  [provider]  oxyCODONE-acetaminophen (PERCOCET/ROXICET) 5-325 MG tablet Take 2  tablets by mouth every 4 (four) hours as needed. 07/08/15   Rolland PorterJames, Mark, MD  predniSONE (DELTASONE) 20 MG tablet Take 1 tablet (20 mg total) by mouth 2 (two) times daily with a meal. 07/08/15   Rolland PorterJames, Mark, MD  Varenicline Tartrate (CHANTIX PO) Take by mouth.    [provider]    Family History History reviewed. No pertinent family history.  Social History Social History   Tobacco Use  . Smoking status: Current Every Day Smoker    Packs/day: 0.50    Types: Cigarettes  . Smokeless tobacco: Never Used  Substance Use Topics  . Alcohol use: No  . Drug use: No     Allergies   Nitrofurantoin and Vicodin [hydrocodone-acetaminophen]   Review of Systems Review of Systems  Constitutional: Negative for activity change.  Respiratory: Negative for shortness of breath.   Cardiovascular: Negative for chest pain.  Gastrointestinal: Positive for nausea and vomiting. Negative for abdominal pain.  All other systems reviewed and are negative.    Physical Exam Updated Vital Signs BP 115/66 (BP Location: Left Arm)   Pulse 80   Temp 98.8 F (37.1 C) (Oral)   Resp 18   Ht 5\' 3"  (1.6 m)   Wt 89.8 kg (198 lb)   LMP 03/08/2017 (Approximate)   SpO2 100%  BMI 35.07 kg/m   Physical Exam  Constitutional: She appears well-developed and well-nourished. No distress.  HENT:  Head: Normocephalic and atraumatic.  Eyes: Conjunctivae are normal.  Neck: Neck supple.  Cardiovascular: Normal rate and regular rhythm.  No murmur heard. Pulmonary/Chest: Effort normal and breath sounds normal. No respiratory distress.  Abdominal: Soft. There is no tenderness.  Musculoskeletal: She exhibits no edema.  Neurological: She is alert.  Skin: Skin is warm and dry.  Psychiatric: She has a normal mood and affect.  Nursing note and vitals reviewed.    ED Treatments / Results  Labs (all labs ordered are listed, but only abnormal results are displayed) Labs Reviewed  URINALYSIS, ROUTINE W REFLEX  MICROSCOPIC - Abnormal; Notable for the following components:      Result Value   APPearance CLOUDY (*)    Specific Gravity, Urine >1.030 (*)    Hgb urine dipstick TRACE (*)    Bilirubin Urine MODERATE (*)    Ketones, ur 15 (*)    Protein, ur 100 (*)    All other components within normal limits  CBC - Abnormal; Notable for the following components:   WBC 19.3 (*)    RBC 5.73 (*)    Hemoglobin 18.4 (*)    HCT 52.9 (*)    All other components within normal limits  URINALYSIS, MICROSCOPIC (REFLEX) - Abnormal; Notable for the following components:   Bacteria, UA FEW (*)    Squamous Epithelial / LPF 6-30 (*)    All other components within normal limits  DIFFERENTIAL - Abnormal; Notable for the following components:   Neutro Abs 18.3 (*)    Lymphs Abs 0.5 (*)    All other components within normal limits  PREGNANCY, URINE  BASIC METABOLIC PANEL    EKG  EKG Interpretation None       Radiology No results found.  Procedures Procedures (including critical care time)  Medications Ordered in ED Medications  ondansetron (ZOFRAN) injection 4 mg (4 mg Intravenous Given 03/15/17 1659)  sodium chloride 0.9 % bolus 1,000 mL (1,000 mLs Intravenous New Bag/Given 03/15/17 1701)     Initial Impression / Assessment and Plan / ED Course  I have reviewed the triage vital signs and the nursing notes.  Pertinent labs & imaging results that were available during my care of the patient were reviewed by me and considered in my medical decision making (see chart for details).     Well-appearing 32 year old with symptoms for less than 6 hours.  Likely viral gastro neuritis.  Will treat with nausea medicine, fluids.  Will p.o. challenge and have patient stay hydrated at home with Gatorade.  Final Clinical Impressions(s) / ED Diagnoses   Final diagnoses:  None    ED Discharge Orders    None       Abelino Derrick, MD 03/15/17 2316

## 2017-03-15 NOTE — ED Notes (Signed)
Pt c/o n/v/d that started this morning, states her children had the same symptoms yesterday.

## 2017-03-15 NOTE — Discharge Instructions (Signed)
Return with any concerns.  Please use Gatorade and water and take it in small amounts to help keep hydrated.

## 2017-08-07 IMAGING — CR DG CHEST 1V
1 series · 1 of 1 positions shown · non-contrast
Comparison: None.

CLINICAL DATA: Chest congestion and cough.

EXAM:
CHEST 1 VIEW

[w chest pa]
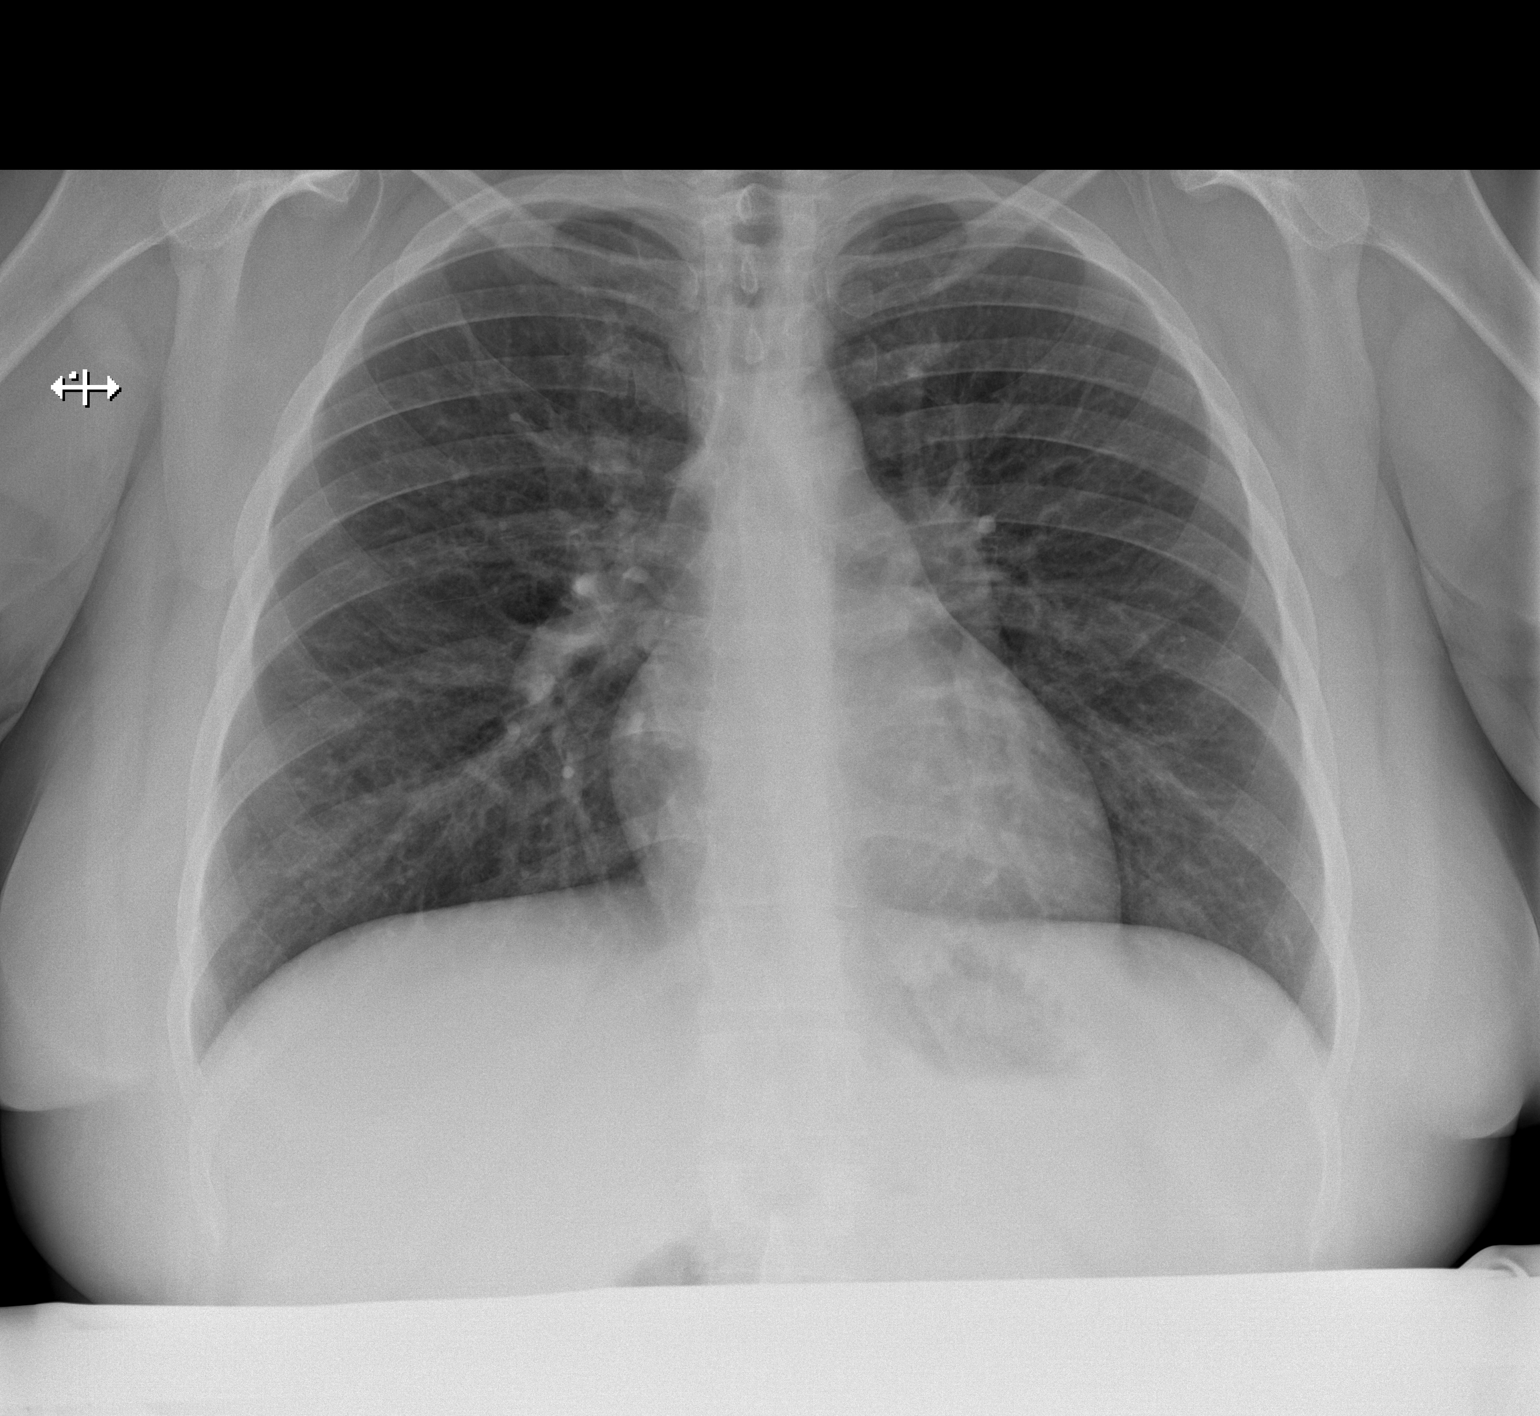

[1 of 1 positions shown; findings below may reference images not displayed]

FINDINGS: Mediastinum and hilar structures normal. Lungs are clear. Heart size
normal. Low lung volumes. No pleural effusion or pneumothorax.
IMPRESSION: Low lung volumes.  No acute cardiopulmonary disease .

## 2018-10-11 ENCOUNTER — Emergency Department (HOSPITAL_BASED_OUTPATIENT_CLINIC_OR_DEPARTMENT_OTHER)
Admission: EM | Admit: 2018-10-11 | Discharge: 2018-10-11 | Discharge status: Against Medical Advice | Payer: Self-pay | Attending: Emergency Medicine | Admitting: Emergency Medicine

## 2018-10-11 ENCOUNTER — Other Ambulatory Visit: Payer: Self-pay

## 2018-10-11 ENCOUNTER — Encounter (HOSPITAL_BASED_OUTPATIENT_CLINIC_OR_DEPARTMENT_OTHER): Payer: Self-pay

## 2018-10-11 DIAGNOSIS — R109 Unspecified abdominal pain: Secondary | ICD-10-CM | POA: Insufficient documentation

## 2018-10-11 DIAGNOSIS — F17210 Nicotine dependence, cigarettes, uncomplicated: Secondary | ICD-10-CM | POA: Insufficient documentation

## 2018-10-11 DIAGNOSIS — I10 Essential (primary) hypertension: Secondary | ICD-10-CM | POA: Insufficient documentation

## 2018-10-11 DIAGNOSIS — R350 Frequency of micturition: Secondary | ICD-10-CM | POA: Insufficient documentation

## 2018-10-11 DIAGNOSIS — Z79899 Other long term (current) drug therapy: Secondary | ICD-10-CM | POA: Insufficient documentation

## 2018-10-11 DIAGNOSIS — R300 Dysuria: Secondary | ICD-10-CM | POA: Insufficient documentation

## 2018-10-11 DIAGNOSIS — N899 Noninflammatory disorder of vagina, unspecified: Secondary | ICD-10-CM | POA: Insufficient documentation

## 2018-10-11 LAB — URINALYSIS, ROUTINE W REFLEX MICROSCOPIC
Bilirubin Urine: NEGATIVE
Glucose, UA: NEGATIVE mg/dL
Ketones, ur: NEGATIVE mg/dL
Nitrite: NEGATIVE
Protein, ur: NEGATIVE mg/dL
Specific Gravity, Urine: 1.02 (ref 1.005–1.030)
pH: 7 (ref 5.0–8.0)

## 2018-10-11 LAB — PREGNANCY, URINE: Preg Test, Ur: NEGATIVE

## 2018-10-11 LAB — URINALYSIS, MICROSCOPIC (REFLEX)

## 2018-10-11 NOTE — ED Provider Notes (Signed)
[facility] EMERGENCY DEPARTMENT Provider Note   CSN: [id number] Arrival date & time: 10/11/18  1841     History   Chief Complaint Chief Complaint  Patient presents with  . Dysuria    HPI Brittany Finley is a 33 y.o. female.     Brittany Finley is a 33 y.o. female with a history of hypertension, migraines, who presents to the ED for evaluation of dysuria.  She reports for 1 week she has had burning and pain with urination as well as some urinary frequency.  She has had some occasional suprapubic discomfort.  She also reports that she has had some thick vaginal discharge, no vaginal bleeding, regular menstrual cycles,  LMP on 09/30/2018.  Does have a history of yeast infections.  No flank pain, no fevers or chills, no nausea or vomiting.  Has been taking Azo without improvement, no other aggravating or alleviating factors.     Past Medical History:  Diagnosis Date  . Gestational diabetes    gestational  . Hypertension   . Migraine   . Tension headache     There are no active problems to display for this patient.   Past Surgical History:  Procedure Laterality Date  . TUBAL LIGATION    . WISDOM TOOTH EXTRACTION       OB History    Gravida  5   Para  3   Term  2   Preterm  1   AB      Living  3     SAB      TAB      Ectopic      Multiple      Live Births               Home Medications    Prior to Admission medications   Medication Sig Start Date End Date Taking? Authorizing Provider  amoxicillin (AMOXIL) 500 MG capsule Take 1 capsule (500 mg total) by mouth 3 (three) times daily. 07/08/15   Rolland PorterJames, Mark, MD  amoxicillin-clavulanate (AUGMENTIN) 875-125 MG tablet Take 1 tablet by mouth every 12 (twelve) hours. 08/23/16   Tilden Fossaees, Elizabeth, MD  butalbital-acetaminophen-caffeine (FIORICET, ESGIC) [phone] MG tablet Take 1-2 tablets by mouth every 6 (six) hours as needed. 10/09/14   [provider]  cetirizine (ZYRTEC) 10 MG tablet Take 1  tablet (10 mg total) by mouth daily. 1 po q day prn allergies 07/08/15   Rolland PorterJames, Mark, MD  escitalopram (LEXAPRO) 10 MG tablet Take 10 mg by mouth daily.    [provider]  insulin NPH Human (HUMULIN N,NOVOLIN N) 100 UNIT/ML injection Administer 5 units once daily at bedtime; units subject to change as used during pregnancy. 02/03/15 03/30/15  [provider]  ondansetron (ZOFRAN ODT) 4 MG disintegrating tablet Take 1 tablet (4 mg total) by mouth every 8 (eight) hours as needed for nausea or vomiting. 03/15/17   Mackuen, Courteney Lyn, MD  oxyCODONE-acetaminophen (PERCOCET/ROXICET) 5-325 MG tablet Take 2 tablets by mouth every 4 (four) hours as needed. 07/08/15   Rolland PorterJames, Mark, MD  predniSONE (DELTASONE) 20 MG tablet Take 1 tablet (20 mg total) by mouth 2 (two) times daily with a meal. 07/08/15   Rolland PorterJames, Mark, MD  Varenicline Tartrate (CHANTIX PO) Take by mouth.    [provider]    Family History No family history on file.  Social History Social History   Tobacco Use  . Smoking status: Current Every Day Smoker    Packs/day: 0.50  Types: Cigarettes  . Smokeless tobacco: Never Used  Substance Use Topics  . Alcohol use: No  . Drug use: No     Allergies   Nitrofurantoin and Vicodin [hydrocodone-acetaminophen]   Review of Systems Review of Systems  Constitutional: Negative for chills and fatigue.  Gastrointestinal: Positive for abdominal pain. Negative for nausea and vomiting.  Genitourinary: Positive for dysuria, frequency and vaginal discharge. Negative for flank pain, hematuria, pelvic pain and vaginal bleeding.  Skin: Negative for color change and rash.     Physical Exam Updated Vital Signs BP 114/70 (BP Location: Left Arm)   Pulse 89   Temp 98.8 F (37.1 C) (Oral)   Resp 20   Ht 5\' 3"  (1.6 m)   Wt 76.7 kg   LMP 09/30/2018   SpO2 99%   BMI 29.94 kg/m   Physical Exam Vitals signs and nursing note reviewed.  Constitutional:      General: She is  not in acute distress.    Appearance: Normal appearance. She is well-developed and normal weight. She is not diaphoretic.  HENT:     Head: Normocephalic and atraumatic.  Eyes:     General:        Right eye: No discharge.        Left eye: No discharge.  Pulmonary:     Effort: Pulmonary effort is normal. No respiratory distress.  Abdominal:     Comments: Abdomen soft, nondistended, bowel sounds present, minimal tenderness over the suprapubic region, abdomen otherwise nontender, no CVA tenderness.  Skin:    General: Skin is warm and dry.  Neurological:     Mental Status: She is alert and oriented to person, place, and time.     Coordination: Coordination normal.  Psychiatric:        Mood and Affect: Mood normal.        Behavior: Behavior normal.      ED Treatments / Results  Labs (all labs ordered are listed, but only abnormal results are displayed) Labs Reviewed  URINALYSIS, ROUTINE W REFLEX MICROSCOPIC - Abnormal; Notable for the following components:      Result Value   Hgb urine dipstick TRACE (*)    Leukocytes,Ua SMALL (*)    All other components within normal limits  URINALYSIS, MICROSCOPIC (REFLEX) - Abnormal; Notable for the following components:   Bacteria, UA FEW (*)    All other components within normal limits  WET PREP, GENITAL  PREGNANCY, URINE  GC/CHLAMYDIA PROBE AMP ([place]) NOT AT [facility]    EKG None  Radiology No results found.  Procedures Procedures (including critical care time)  Medications Ordered in ED Medications - No data to display   Initial Impression / Assessment and Plan / ED Course  I have reviewed the triage vital signs and the nursing notes.  Pertinent labs & imaging results that were available during my care of the patient were reviewed by me and considered in my medical decision making (see chart for details).  Patient presents for day 1 week of dysuria.  No fevers, no nausea, reports a mild suprapubic pain.  Patient also  reports thick vaginal discharge.  Urinalysis collected with 11-20 WBCs but only few bacteria and some squamous cells, small leukocytes but no nitrites.  Given that patient is having vaginal discharge I am concerned this may be more likely the cause of her dysuria.  We will plan to do pelvic exam.  Patient came to nursing station she is unable to wait for pelvic exam, patient  seen leaving the department, attempted to speak to patient and do her pelvic exam but reports she has to go when she cannot stay to wait for results either.  Patient states she will follow-up with her OB/GYN.  Final Clinical Impressions(s) / ED Diagnoses   Final diagnoses:  Dysuria    ED Discharge Orders    None       Legrand Rams 10/11/18 2151    Alvira Monday, MD 10/12/18 1531

## 2018-10-11 NOTE — ED Triage Notes (Signed)
Pt c/o dysuria x 1 week-NAD-steady gait

## 2018-10-11 NOTE — ED Notes (Signed)
Pt declining RPR/HIV at this time. Pt reports having physical coming up and will have labs drawn then.

## 2023-06-15 ENCOUNTER — Emergency Department (HOSPITAL_BASED_OUTPATIENT_CLINIC_OR_DEPARTMENT_OTHER)
Admission: EM | Admit: 2023-06-15 | Discharge: 2023-06-16 | Disposition: A | Discharge status: Home / Self Care | Payer: MEDICAID

## 2023-06-15 ENCOUNTER — Emergency Department (HOSPITAL_BASED_OUTPATIENT_CLINIC_OR_DEPARTMENT_OTHER): Payer: MEDICAID

## 2023-06-15 ENCOUNTER — Encounter (HOSPITAL_BASED_OUTPATIENT_CLINIC_OR_DEPARTMENT_OTHER): Payer: Self-pay | Admitting: Emergency Medicine

## 2023-06-15 ENCOUNTER — Other Ambulatory Visit: Payer: Self-pay

## 2023-06-15 DIAGNOSIS — B002 Herpesviral gingivostomatitis and pharyngotonsillitis: Secondary | ICD-10-CM

## 2023-06-15 DIAGNOSIS — I10 Essential (primary) hypertension: Secondary | ICD-10-CM | POA: Insufficient documentation

## 2023-06-15 DIAGNOSIS — B0089 Other herpesviral infection: Secondary | ICD-10-CM | POA: Diagnosis not present

## 2023-06-15 DIAGNOSIS — K0889 Other specified disorders of teeth and supporting structures: Secondary | ICD-10-CM | POA: Diagnosis present

## 2023-06-15 DIAGNOSIS — Z794 Long term (current) use of insulin: Secondary | ICD-10-CM | POA: Diagnosis not present

## 2023-06-15 DIAGNOSIS — Z79899 Other long term (current) drug therapy: Secondary | ICD-10-CM | POA: Diagnosis not present

## 2023-06-15 LAB — COMPREHENSIVE METABOLIC PANEL WITH GFR
ALT: 15 U/L (ref 0–44)
AST: 19 U/L (ref 15–41)
Albumin: 4.6 g/dL (ref 3.5–5.0)
Alkaline Phosphatase: 83 U/L (ref 38–126)
Anion gap: 18 — ABNORMAL HIGH (ref 5–15)
BUN: 12 mg/dL (ref 6–20)
CO2: 17 mmol/L — ABNORMAL LOW (ref 22–32)
Calcium: 8.9 mg/dL (ref 8.9–10.3)
Chloride: 102 mmol/L (ref 98–111)
Creatinine, Ser: 0.92 mg/dL (ref 0.44–1.00)
GFR, Estimated: >60 mL/min (ref >60)
Glucose, Bld: 100 mg/dL — ABNORMAL HIGH (ref 70–99)
Potassium: 3.4 mmol/L — ABNORMAL LOW (ref 3.5–5.1)
Sodium: 137 mmol/L (ref 135–145)
Total Bilirubin: 0.4 mg/dL (ref 0.0–1.2)
Total Protein: 7.8 g/dL (ref 6.5–8.1)

## 2023-06-15 LAB — CBC WITH DIFFERENTIAL/PLATELET
Abs Immature Granulocytes: 0.06 10*3/uL (ref 0.00–0.07)
Basophils Absolute: 0 10*3/uL (ref 0.0–0.1)
Basophils Relative: 0 %
Eosinophils Absolute: 0 10*3/uL (ref 0.0–0.5)
Eosinophils Relative: 0 %
HCT: 44 % (ref 36.0–46.0)
Hemoglobin: 15.4 g/dL — ABNORMAL HIGH (ref 12.0–15.0)
Immature Granulocytes: 1 %
Lymphocytes Relative: 23 %
Lymphs Abs: 3 10*3/uL (ref 0.7–4.0)
MCH: 32 pg (ref 26.0–34.0)
MCHC: 35 g/dL (ref 30.0–36.0)
MCV: 91.3 fL (ref 80.0–100.0)
Monocytes Absolute: 0.6 10*3/uL (ref 0.1–1.0)
Monocytes Relative: 5 %
Neutro Abs: 9.1 10*3/uL — ABNORMAL HIGH (ref 1.7–7.7)
Neutrophils Relative %: 71 %
Platelets: 358 10*3/uL (ref 150–400)
RBC: 4.82 MIL/uL (ref 3.87–5.11)
RDW: 12.3 % (ref 11.5–15.5)
WBC: 12.8 10*3/uL — ABNORMAL HIGH (ref 4.0–10.5)
nRBC: 0 % (ref 0.0–0.2)

## 2023-06-15 LAB — URINE DRUG SCREEN
Amphetamines: DETECTED — AB
Barbiturates: NOT DETECTED
Benzodiazepines: NOT DETECTED
Cocaine: NOT DETECTED
Fentanyl: NOT DETECTED
Methadone Scn, Ur: NOT DETECTED
Opiates: NOT DETECTED
Tetrahydrocannabinol: DETECTED — AB

## 2023-06-15 LAB — PREGNANCY, URINE: Preg Test, Ur: NEGATIVE

## 2023-06-15 MED ORDER — OXYCODONE HCL 5 MG PO TABS
5.0000 mg | ORAL_TABLET | Freq: Once | ORAL | Status: AC
  Administered 2023-06-15: 5 mg via ORAL
  Filled 2023-06-15: qty 1

## 2023-06-15 MED ORDER — IOHEXOL 300 MG/ML  SOLN
75.0000 mL | Freq: Once | INTRAMUSCULAR | Status: AC | PRN
  Administered 2023-06-15: 75 mL via INTRAVENOUS

## 2023-06-15 MED ORDER — KETOROLAC TROMETHAMINE 15 MG/ML IJ SOLN
15.0000 mg | Freq: Once | INTRAMUSCULAR | Status: AC
  Administered 2023-06-15: 15 mg via INTRAVENOUS
  Filled 2023-06-15: qty 1

## 2023-06-15 MED ORDER — VALACYCLOVIR HCL 1 G PO TABS: 1000.0000 mg | ORAL_TABLET | Freq: Two times a day (BID) | ORAL | 0 refills | Status: AC

## 2023-06-15 NOTE — ED Provider Notes (Incomplete)
 Received handoff from Rexie Catena, PA-C at shift change, see note for initial work-up/assessment.   Patient is presenting with pain below left side of her tongue/cheek with associated throat pain/difficulty opening her mouth, possible oral lesions noted on prior PA's evaluation.  Had tooth extraction on April 3, currently finishing course of amoxicillin .   Physical Exam  BP (!) 138/104   Pulse (!) 109   Temp (!) 97.1 F (36.2 C)   Resp 18   Ht 5\' 3"  (1.6 m)   Wt 88.5 kg   LMP 06/06/2023   SpO2 97%   BMI 34.54 kg/m   Physical Exam  Procedures  Procedures  ED Course / MDM    Medical Decision Making 7pm: CT pending, labs notable for mild leukocytosis at 12.8 and detection of amphetamines and THC on urine drug screen.  11:50pm: CT results as below 1. Extracted posterior-most right mandibular molar. No focal surrounding soft tissue edema or drainable fluid collection. 2. Mild hyperenhancement of the tonsils, nonspecific but potentially infectious/inflammatory. Correlate with direct inspection. 3. Mildly prominent retropharyngeal lymph node and right upper cervical chain lymph nodes, nonspecific but potentially reactive.   Amount and/or Complexity of Data Reviewed Labs: ordered. Radiology: ordered.  Risk Prescription drug management.

## 2023-06-15 NOTE — ED Provider Notes (Signed)
 [place] EMERGENCY DEPARTMENT AT [facility] Provider Note   CSN: [id number] Arrival date & time: 06/15/23  1616     History  Chief Complaint  Patient presents with   Dental Pain    Brittany Finley is a 38 y.o. female with history of hypertension, substance use, borderline personality disorder, presents with concern for pain under the right side of her tongue ongoing for over a month.  States she had a tooth extraction April 3, but pain has been persisting even after the extraction.  She also reports some pain in her throat and difficulty opening her jaw fully which has worsened in the past couple days.  States she was seen by outside ER and prescribed Augmentin  and has taken 3 days worth of this antibiotic without improvement in symptoms.  Denies fever or chills, denies any difficulty swallowing, denies any difficulty breathing   Dental Pain Associated symptoms: no fever        Home Medications Prior to Admission medications   Medication Sig Start Date End Date Taking? Authorizing Provider  valACYclovir (VALTREX) 1000 MG tablet Take 1 tablet (1,000 mg total) by mouth 2 (two) times daily for 7 days. 06/15/23 06/22/23 Yes Rexie Catena, PA-C  amoxicillin  (AMOXIL ) 500 MG capsule Take 1 capsule (500 mg total) by mouth 3 (three) times daily. 07/08/15   Eino Gravel, MD  amoxicillin -clavulanate (AUGMENTIN ) 875-125 MG tablet Take 1 tablet by mouth every 12 (twelve) hours. 08/23/16   Kelsey Patricia, MD  butalbital-acetaminophen -caffeine (FIORICET, ESGIC) 50-325-40 MG tablet Take 1-2 tablets by mouth every 6 (six) hours as needed. 10/09/14   [provider]  cetirizine  (ZYRTEC ) 10 MG tablet Take 1 tablet (10 mg total) by mouth daily. 1 po q day prn allergies 07/08/15   Eino Gravel, MD  escitalopram (LEXAPRO) 10 MG tablet Take 10 mg by mouth daily.    [provider]  insulin NPH Human (HUMULIN N,NOVOLIN N) 100 UNIT/ML injection Administer 5 units once daily at  bedtime; units subject to change as used during pregnancy. 02/03/15 03/30/15  [provider]  ondansetron  (ZOFRAN  ODT) 4 MG disintegrating tablet Take 1 tablet (4 mg total) by mouth every 8 (eight) hours as needed for nausea or vomiting. 03/15/17   Mackuen, Courteney Lyn, MD  oxyCODONE -acetaminophen  (PERCOCET/ROXICET) 5-325 MG tablet Take 2 tablets by mouth every 4 (four) hours as needed. 07/08/15   Eino Gravel, MD  predniSONE  (DELTASONE ) 20 MG tablet Take 1 tablet (20 mg total) by mouth 2 (two) times daily with a meal. 07/08/15   Eino Gravel, MD  Varenicline Tartrate (CHANTIX PO) Take by mouth.    [provider]      Allergies    Nitrofurantoin and Vicodin [hydrocodone-acetaminophen ]    Review of Systems   Review of Systems  Constitutional:  Negative for fever.  HENT:  Positive for dental problem.     Physical Exam Updated Vital Signs BP (!) 138/104   Pulse (!) 109   Temp (!) 97.1 F (36.2 C)   Resp 18   Ht 5\' 3"  (1.6 m)   Wt 88.5 kg   LMP 06/06/2023   SpO2 97%   BMI 34.54 kg/m  Physical Exam Vitals and nursing note reviewed.  Constitutional:      General: She is not in acute distress.    Appearance: Normal appearance. She is not toxic-appearing.  HENT:     Head: Atraumatic.     Mouth/Throat:     Comments: Difficulty opening mouth fully, states it  is due to pain in her jaw.  Patient with multiple dental caries.  Site of dental extraction noted where her right lower molars would be.  Gums have healed over and do not have any erythema, edema, no sign of abscess.  Patient with ulceration underneath right side of tongue and on the left buccal mucosal surface.  Appears to be consistent with herpes   Swallowing secretions without difficulty.  No peritonsillar abscess   Neck:     Comments: Neck soft and supple, but she does have tenderness when palpating the anterior aspect of the neck.  No overlying skin changes Cardiovascular:     Rate and Rhythm: Normal rate  and regular rhythm.  Pulmonary:     Effort: Pulmonary effort is normal.  Neurological:     General: No focal deficit present.     Mental Status: She is alert.  Psychiatric:        Mood and Affect: Mood normal.     Comments: Appears very anxious     ED Results / Procedures / Treatments   Labs (all labs ordered are listed, but only abnormal results are displayed) Labs Reviewed  URINE DRUG SCREEN - Abnormal; Notable for the following components:      Result Value   Amphetamines DETECTED (*)    Tetrahydrocannabinol DETECTED (*)    All other components within normal limits  CBC WITH DIFFERENTIAL/PLATELET - Abnormal; Notable for the following components:   WBC 12.8 (*)    Hemoglobin 15.4 (*)    Neutro Abs 9.1 (*)    All other components within normal limits  PREGNANCY, URINE  BASIC METABOLIC PANEL WITH GFR    EKG None  Radiology No results found.  Procedures Procedures    Medications Ordered in ED Medications  ketorolac (TORADOL) 15 MG/ML injection 15 mg (15 mg Intravenous Given 06/15/23 1850)    ED Course/ Medical Decision Making/ A&P                                 Medical Decision Making Amount and/or Complexity of Data Reviewed Labs: ordered.     Differential diagnosis includes but is not limited to Infection from dental work, dental abscess or infection, ludwig angina, osteomyelitis, herpes sores   ED Course:  Upon initial evaluation, patient is anxious appearing, does have tachycardia upon arrival at 109.  UDS positive for amphetamines and THC.  Afebrile.  Suspect tachycardia may be due to her illicit substance use.  She is reporting some discomfort underneath her right tongue that has been going on for a couple weeks now.  On exam, it does appear she has a ulceration under her right tongue and ulceration of the left buccal mucosal surface consistent with herpes.  Suspect this is causing her discomfort.  However, she also says she has been having more  difficulty opening her jaw and having some pain in her throat over the last couple of days.  Has some tenderness with palpation of the anterior aspect of neck, but no overlying skin changes, neck is soft and supple.  Given the reported neck pain and mild trismus, will proceed with CT of the neck to rule out any deep space infection given her recent oral surgery and worsening pain.  Labs Ordered: I Ordered, and personally interpreted labs.  The pertinent results include:   CBC and BMP pending UDS positive for amphetamines and THC Pregnancy negative  Imaging Studies ordered: I  ordered imaging studies including CT soft tissue neck, pending scan results    Medications Given: Toradol for pain  Impression: Oral herpes   Disposition:  Care of this patient signed out to oncoming ED provider Cris Dollar, PA-C to follow up on CT. If CT scan does not show any deep space infection, feel patient can continue her course of Augmentin  as prescribed by outside ER, and start on course of valacyclovir for oral herpes, and be discharged home.  Disposition and treatment plan pending imaging results and clinical judgment of oncoming ED team.      Record Review: External records from outside source obtained and reviewed including outside ER visit from 06/15/2023 where she was seen for dental pain and prescribed Augmentin , recommended to follow-up with her oral surgeon     This chart was dictated using voice recognition software, Dragon. Despite the best efforts of this provider to proofread and correct errors, errors may still occur which can change documentation meaning.          Final Clinical Impression(s) / ED Diagnoses Final diagnoses:  Oral herpes simplex infection    Rx / DC Orders ED Discharge Orders          Ordered    valACYclovir (VALTREX) 1000 MG tablet  2 times daily        06/15/23 1854              Rexie Catena, PA-C 06/15/23 1902    Carin Charleston,  MD 06/15/23 [phone]

## 2023-06-15 NOTE — ED Triage Notes (Signed)
 Pt presents to triage with c/o dental pain generalized.  No known fever.  Pt had tooth extraction on April 3 and has had some issues since then.  Pt states she is having extreme pain since this weekend.  Pt has been to ED Tuesday and oral surgeon today for the same.  Pt speaking very rapidly in triage.

## 2023-06-15 NOTE — Discharge Instructions (Addendum)
 It appears you have some ulcers on your tongue and on the left side of your cheek from herpes.  You have been prescribed an antiviral called valacyclovir  to help treat this.  Please take this twice daily for the next 7 days.  Please follow-up with your dentist or oral surgeon within the next week for recheck of symptoms. Continue your course of Amoxicillin  as previously directed.   Return to the ER for any painful swallowing, fevers, worsening pain, any other new or concerning symptoms

## 2023-06-15 NOTE — ED Provider Notes (Signed)
 Received handoff from Rexie Catena, PA-C at shift change, see note for initial work-up/assessment.   Patient is presenting with pain below left side of her tongue/cheek with associated throat pain/difficulty opening her mouth, possible oral lesions noted on prior PA's evaluation.  Had tooth extraction on April 3, currently finishing course of amoxicillin .   Physical Exam   Vitals:   06/15/23 2030 06/15/23 2042 06/15/23 2100 06/15/23 2130  BP:  116/76 (!) 129/104 130/85  Pulse: 94 71 76 79  Resp:  18  20  Temp:  99 F (37.2 C)    TempSrc:  Oral    SpO2: 97% 99% 100% 100%  Weight:  88.6 kg    Height:  5\' 3"  (1.6 m)       Physical Exam HENT:     Head: Normocephalic.     Mouth/Throat:     Comments: Poor dentition with multiple dental caries, handling secretions well without difficulty.  Eyes:     Extraocular Movements: Extraocular movements intact.  Cardiovascular:     Rate and Rhythm: Normal rate.  Pulmonary:     Effort: Pulmonary effort is normal.  Musculoskeletal:     Cervical back: Normal range of motion.     Comments: Moves all extremities spontaneously without difficulty  Neurological:     General: No focal deficit present.     Procedures  Procedures  ED Course / MDM    Medical Decision Making 7pm: CT pending, labs notable for mild leukocytosis at 12.8 and detection of amphetamines and THC on urine drug screen.  8:30pm: 5mg  Oxycodone  administered for dental pain, patient reports symptomatic improvement.  11:50pm: CT results as below 1. Extracted posterior-most right mandibular molar. No focal surrounding soft tissue edema or drainable fluid collection. 2. Mild hyperenhancement of the tonsils, nonspecific but potentially infectious/inflammatory. Correlate with direct inspection. 3. Mildly prominent retropharyngeal lymph node and right upper cervical chain lymph nodes, nonspecific but potentially reactive.   Patient reports she has been able to rest and  has had improvement in her symptoms since initial presentation. Discussed CT findings with patient. Advised patient to continue her course of Amoxicillin  as previously prescribed, start Valacyclovir  twice daily for oral ulcers as noted on previous provider's physical exam. Advised patient to follow-up with her oral surgeon as previously scheduled. Return precautions discussed. Patient is agreeable with this plan and is appropriate for discharge at this time.   Amount and/or Complexity of Data Reviewed Labs: ordered. Radiology: ordered.  Risk Prescription drug management.          Kendrick Pax, PA-C 06/16/23 0017    Carin Charleston, MD 06/18/23 [phone]
